# Patient Record
Sex: Male | Born: 2000 | Race: Black or African American | Hispanic: No | Marital: Single | Smoking: Never smoker
Health system: Southern US, Community
[De-identification: ages and names within clinical notes are randomized; demographics above are authoritative.]

---
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
## 2019-01-26 ENCOUNTER — Observation Stay (HOSPITAL_COMMUNITY)
Admission: EM | Admit: 2019-01-26 | Discharge: 2019-01-27 | Disposition: A | Discharge status: Home / Self Care | Payer: BC Managed Care – PPO | Attending: Neurological Surgery | Admitting: Neurological Surgery

## 2019-01-26 ENCOUNTER — Emergency Department (HOSPITAL_COMMUNITY): Payer: BC Managed Care – PPO

## 2019-01-26 ENCOUNTER — Other Ambulatory Visit: Payer: Self-pay

## 2019-01-26 ENCOUNTER — Encounter (HOSPITAL_COMMUNITY): Payer: Self-pay | Admitting: Emergency Medicine

## 2019-01-26 DIAGNOSIS — W3400XA Accidental discharge from unspecified firearms or gun, initial encounter: Secondary | ICD-10-CM

## 2019-01-26 DIAGNOSIS — S0103XA Puncture wound without foreign body of scalp, initial encounter: Secondary | ICD-10-CM | POA: Diagnosis present

## 2019-01-26 DIAGNOSIS — I609 Nontraumatic subarachnoid hemorrhage, unspecified: Secondary | ICD-10-CM

## 2019-01-26 DIAGNOSIS — S066X9A Traumatic subarachnoid hemorrhage with loss of consciousness of unspecified duration, initial encounter: Secondary | ICD-10-CM | POA: Diagnosis not present

## 2019-01-26 DIAGNOSIS — S0101XA Laceration without foreign body of scalp, initial encounter: Secondary | ICD-10-CM | POA: Diagnosis present

## 2019-01-26 DIAGNOSIS — Z20828 Contact with and (suspected) exposure to other viral communicable diseases: Secondary | ICD-10-CM | POA: Diagnosis not present

## 2019-01-26 LAB — COMPREHENSIVE METABOLIC PANEL
ALT: 23 U/L (ref 0–44)
AST: 27 U/L (ref 15–41)
Albumin: 4.3 g/dL (ref 3.5–5.0)
Alkaline Phosphatase: 95 U/L (ref 38–126)
Anion gap: 8 (ref 5–15)
BUN: 10 mg/dL (ref 6–20)
CO2: 27 mmol/L (ref 22–32)
Calcium: 9.9 mg/dL (ref 8.9–10.3)
Chloride: 103 mmol/L (ref 98–111)
Creatinine, Ser: 1.16 mg/dL (ref 0.61–1.24)
GFR calc Af Amer: >60 mL/min (ref >60)
GFR calc non Af Amer: >60 mL/min (ref >60)
Glucose, Bld: 106 mg/dL — ABNORMAL HIGH (ref 70–99)
Potassium: 4 mmol/L (ref 3.5–5.1)
Sodium: 138 mmol/L (ref 135–145)
Total Bilirubin: 0.7 mg/dL (ref 0.3–1.2)
Total Protein: 6.8 g/dL (ref 6.5–8.1)

## 2019-01-26 LAB — CBC
HCT: 42.2 % (ref 39.0–52.0)
Hemoglobin: 14.3 g/dL (ref 13.0–17.0)
MCH: 30.6 pg (ref 26.0–34.0)
MCHC: 33.9 g/dL (ref 30.0–36.0)
MCV: 90.4 fL (ref 80.0–100.0)
Platelets: 279 10*3/uL (ref 150–400)
RBC: 4.67 MIL/uL (ref 4.22–5.81)
RDW: 12.2 % (ref 11.5–15.5)
WBC: 9.3 10*3/uL (ref 4.0–10.5)
nRBC: 0 % (ref 0.0–0.2)

## 2019-01-26 LAB — HIV ANTIBODY (ROUTINE TESTING W REFLEX): HIV Screen 4th Generation wRfx: NONREACTIVE

## 2019-01-26 LAB — SAMPLE TO BLOOD BANK

## 2019-01-26 LAB — SARS CORONAVIRUS 2 (TAT 6-24 HRS): SARS Coronavirus 2: NEGATIVE

## 2019-01-26 LAB — ETHANOL: Alcohol, Ethyl (B): <10 mg/dL (ref <10)

## 2019-01-26 MED ORDER — DEXAMETHASONE SODIUM PHOSPHATE 4 MG/ML IJ SOLN
4.0000 mg | Freq: Four times a day (QID) | INTRAMUSCULAR | Status: AC
  Administered 2019-01-26 – 2019-01-27 (×3): 4 mg via INTRAVENOUS
  Filled 2019-01-26 (×3): qty 1

## 2019-01-26 MED ORDER — ERYTHROMYCIN 5 MG/GM OP OINT
TOPICAL_OINTMENT | Freq: Three times a day (TID) | OPHTHALMIC | Status: DC
  Administered 2019-01-26: 07:00:00 via OPHTHALMIC
  Filled 2019-01-26 (×2): qty 3.5

## 2019-01-26 MED ORDER — ONDANSETRON HCL 4 MG/2ML IJ SOLN: 4.0000 mg | Freq: Four times a day (QID) | INTRAMUSCULAR | Status: DC | PRN

## 2019-01-26 MED ORDER — TRAMADOL HCL 50 MG PO TABS: 100.0000 mg | ORAL_TABLET | Freq: Four times a day (QID) | ORAL | Status: DC | PRN

## 2019-01-26 MED ORDER — HYDROCODONE-ACETAMINOPHEN 5-325 MG PO TABS: 1.0000 | ORAL_TABLET | ORAL | Status: DC | PRN

## 2019-01-26 MED ORDER — FENTANYL CITRATE (PF) 100 MCG/2ML IJ SOLN
100.0000 ug | Freq: Once | INTRAMUSCULAR | Status: AC
  Administered 2019-01-26: 06:00:00 100 ug via INTRAVENOUS
  Filled 2019-01-26: qty 2

## 2019-01-26 MED ORDER — LIDOCAINE-EPINEPHRINE-TETRACAINE (LET) SOLUTION
3.0000 mL | Freq: Once | NASAL | Status: AC
  Administered 2019-01-26: 3 mL via TOPICAL
  Filled 2019-01-26: qty 3

## 2019-01-26 MED ORDER — ACETAMINOPHEN 325 MG PO TABS: 650.0000 mg | ORAL_TABLET | Freq: Four times a day (QID) | ORAL | Status: DC | PRN

## 2019-01-26 MED ORDER — TETRACAINE HCL 0.5 % OP SOLN
2.0000 [drp] | Freq: Once | OPHTHALMIC | Status: AC
  Administered 2019-01-26: 05:00:00 2 [drp] via OPHTHALMIC
  Filled 2019-01-26: qty 4

## 2019-01-26 MED ORDER — TETANUS-DIPHTH-ACELL PERTUSSIS 5-2.5-18.5 LF-MCG/0.5 IM SUSP
0.5000 mL | Freq: Once | INTRAMUSCULAR | Status: DC
  Filled 2019-01-26: qty 0.5

## 2019-01-26 MED ORDER — ACETAMINOPHEN 650 MG RE SUPP: 650.0000 mg | Freq: Four times a day (QID) | RECTAL | Status: DC | PRN

## 2019-01-26 MED ORDER — FLUORESCEIN SODIUM 1 MG OP STRP
ORAL_STRIP | OPHTHALMIC | Status: AC
  Filled 2019-01-26: qty 1

## 2019-01-26 MED ORDER — TETANUS-DIPHTH-ACELL PERTUSSIS 5-2.5-18.5 LF-MCG/0.5 IM SUSP: 0.5000 mL | Freq: Once | INTRAMUSCULAR | Status: DC

## 2019-01-26 MED ORDER — ONDANSETRON HCL 4 MG PO TABS: 4.0000 mg | ORAL_TABLET | Freq: Four times a day (QID) | ORAL | Status: DC | PRN
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
## 2019-01-26 NOTE — ED Notes (Signed)
MS   Breakfast ordered  
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
## 2019-01-27 ENCOUNTER — Observation Stay (HOSPITAL_COMMUNITY): Payer: BC Managed Care – PPO

## 2019-01-27 LAB — POCT I-STAT, CHEM 8
BUN: 9 mg/dL (ref 6–20)
Calcium, Ion: 1.28 mmol/L (ref 1.15–1.40)
Chloride: 100 mmol/L (ref 98–111)
Creatinine, Ser: 1.2 mg/dL (ref 0.61–1.24)
Glucose, Bld: 100 mg/dL — ABNORMAL HIGH (ref 70–99)
HCT: 43 % (ref 39.0–52.0)
Hemoglobin: 14.6 g/dL (ref 13.0–17.0)
Potassium: 3.8 mmol/L (ref 3.5–5.1)
Sodium: 140 mmol/L (ref 135–145)
TCO2: 30 mmol/L (ref 22–32)
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
## 2019-02-11 ENCOUNTER — Ambulatory Visit
Admission: RE | Admit: 2019-02-11 | Discharge: 2019-02-11 | Disposition: A | Discharge status: Home / Self Care | Payer: BC Managed Care – PPO | Source: Ambulatory Visit | Attending: Student | Admitting: Student

## 2019-02-11 ENCOUNTER — Other Ambulatory Visit: Payer: Self-pay | Admitting: Student

## 2019-02-11 ENCOUNTER — Other Ambulatory Visit: Payer: Self-pay

## 2019-02-11 DIAGNOSIS — I609 Nontraumatic subarachnoid hemorrhage, unspecified: Secondary | ICD-10-CM
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
## 2022-10-05 ENCOUNTER — Encounter (HOSPITAL_BASED_OUTPATIENT_CLINIC_OR_DEPARTMENT_OTHER): Payer: Self-pay

## 2022-10-05 ENCOUNTER — Other Ambulatory Visit: Payer: Self-pay

## 2022-10-05 ENCOUNTER — Emergency Department (HOSPITAL_BASED_OUTPATIENT_CLINIC_OR_DEPARTMENT_OTHER)
Admission: EM | Admit: 2022-10-05 | Discharge: 2022-10-05 | Disposition: A | Discharge status: Home / Self Care | Payer: BC Managed Care – PPO | Attending: Emergency Medicine | Admitting: Emergency Medicine

## 2022-10-05 ENCOUNTER — Emergency Department (HOSPITAL_BASED_OUTPATIENT_CLINIC_OR_DEPARTMENT_OTHER): Payer: BC Managed Care – PPO | Admitting: Radiology

## 2022-10-05 DIAGNOSIS — R001 Bradycardia, unspecified: Secondary | ICD-10-CM | POA: Insufficient documentation

## 2022-10-05 DIAGNOSIS — R072 Precordial pain: Secondary | ICD-10-CM | POA: Insufficient documentation

## 2022-10-05 LAB — CBC
HCT: 42.5 % (ref 39.0–52.0)
Hemoglobin: 14.3 g/dL (ref 13.0–17.0)
MCH: 30.3 pg (ref 26.0–34.0)
MCHC: 33.6 g/dL (ref 30.0–36.0)
MCV: 90 fL (ref 80.0–100.0)
Platelets: 267 10*3/uL (ref 150–400)
RBC: 4.72 MIL/uL (ref 4.22–5.81)
RDW: 12.3 % (ref 11.5–15.5)
WBC: 6.9 10*3/uL (ref 4.0–10.5)
nRBC: 0 % (ref 0.0–0.2)

## 2022-10-05 LAB — BASIC METABOLIC PANEL
Anion gap: 8 (ref 5–15)
BUN: 14 mg/dL (ref 6–20)
CO2: 31 mmol/L (ref 22–32)
Calcium: 10.1 mg/dL (ref 8.9–10.3)
Chloride: 102 mmol/L (ref 98–111)
Creatinine, Ser: 1.22 mg/dL (ref 0.61–1.24)
GFR, Estimated: >60 mL/min (ref >60)
Glucose, Bld: 60 mg/dL — ABNORMAL LOW (ref 70–99)
Potassium: 4 mmol/L (ref 3.5–5.1)
Sodium: 141 mmol/L (ref 135–145)

## 2022-10-05 LAB — TROPONIN I (HIGH SENSITIVITY): Troponin I (High Sensitivity): 5 ng/L (ref <18)

## 2022-10-05 NOTE — Discharge Instructions (Signed)
You were seen in the emergency department today for chest pain.  As we discussed your lab work, EKG, chest x-ray all looked reassuring today.   I recommend monitoring your stress levels and to follow-up with your primary care doctor at your earliest convenience.  Continue to monitor how you are doing overall, and return to the emergency department for any new or worsening symptoms such as: Worsening pain or pain with exertion, difficulty breathing, sweating, or pain or swelling in your legs.

## 2022-10-05 NOTE — ED Provider Notes (Signed)
[place] EMERGENCY DEPARTMENT AT [facility] Provider Note   CSN: [id number] Arrival date & time: 10/05/22  1903     History  Chief Complaint  Patient presents with   Chest Pain    Chris Turner is a 22 y.o. male.  Patient with no pertinent past medical history presents today with complaints of chest pain.  He states that he first noticed same around 10 AM this morning.  States that it is left-sided in nature and sharp.  It comes and steady intervals without a discernible trigger and last a second or so and then resolves.  It is not worse with exertion or relieved with rest.  It is not worse with movement.  It is not pleuritic in nature reproducible to palpation.  He denies any shortness of breath.  He states that this has happened before but is never lasted this long.  He has never been seen for the symptoms previously.  His last episode was about 30 minutes ago.  He denies any diaphoresis, nausea, vomiting, abdominal pain.  No fevers or chills.  No recent illness, cough, or congestion.  He denies any medical problems or cardiac history.  No family history of heart problems at a young age or unexpected sudden death at a young age.  He does note that he works out regularly but has not done any unusual heavy lifting recently.  Denies any recent travel or surgeries.  No leg pain or leg swelling.  No history of blood clots or malignancy.  He originally went to urgent care for the symptoms and had an EKG that was abnormal and he was sent here for evaluation.  The history is provided by the patient. No language interpreter was used.  Chest Pain      Home Medications Prior to Admission medications   Not on File      Allergies    Patient has no known allergies.    Review of Systems   Review of Systems  Cardiovascular:  Positive for chest pain (no pain currently).  All other systems reviewed and are negative.   Physical Exam Updated Vital Signs BP (!) 144/68    Pulse 79   Temp 98.5 F (36.9 C)   Resp 19   Ht 6\' 4"  (1.93 m)   Wt 99.8 kg   SpO2 97%   BMI 26.78 kg/m  Physical Exam Vitals and nursing note reviewed.  Constitutional:      General: He is not in acute distress.    Appearance: Normal appearance. He is normal weight. He is not ill-appearing, toxic-appearing or diaphoretic.  HENT:     Head: Normocephalic and atraumatic.  Cardiovascular:     Rate and Rhythm: Normal rate and regular rhythm.     Pulses:          Radial pulses are 2+ on the right side and 2+ on the left side.       Dorsalis pedis pulses are 2+ on the right side and 2+ on the left side.       Posterior tibial pulses are 2+ on the right side and 2+ on the left side.     Heart sounds: Normal heart sounds.  Pulmonary:     Effort: Pulmonary effort is normal. No respiratory distress.     Breath sounds: Normal breath sounds.  Chest:     Chest wall: No tenderness.  Abdominal:     Palpations: Abdomen is soft.     Tenderness: There is no  abdominal tenderness.  Musculoskeletal:        General: Normal range of motion.     Cervical back: Normal range of motion.     Right lower leg: No tenderness. No edema.     Left lower leg: No tenderness. No edema.  Skin:    General: Skin is warm and dry.  Neurological:     General: No focal deficit present.     Mental Status: He is alert.  Psychiatric:        Mood and Affect: Mood normal.        Behavior: Behavior normal.     ED Results / Procedures / Treatments   Labs (all labs ordered are listed, but only abnormal results are displayed) Labs Reviewed  BASIC METABOLIC PANEL - Abnormal; Notable for the following components:      Result Value   Glucose, Bld 60 (*)    All other components within normal limits  CBC  TROPONIN I (HIGH SENSITIVITY)    EKG EKG Interpretation Date/Time:  Thursday October 05 2022 19:11:24 EDT Ventricular Rate:  58 PR Interval:  152 QRS Duration:  86 QT Interval:  384 QTC Calculation: 376 R  Axis:   102  Text Interpretation: Sinus bradycardia Rightward axis Nonspecific ST and T wave abnormality No previous tracing Confirmed by Cathren Laine ([id number]) on 10/05/2022 7:21:19 PM  Radiology DG Chest 2 View  Result Date: 10/05/2022 CLINICAL DATA:  Chest pain EXAM: CHEST - 2 VIEW COMPARISON:  None Available. FINDINGS: The heart size and mediastinal contours are within normal limits. No focal airspace consolidation, pleural effusion, or pneumothorax. Scoliotic thoracolumbar curvature. IMPRESSION: No active cardiopulmonary disease. Electronically Signed   By: Duanne Guess D.O.   On: 10/05/2022 19:30    Procedures Procedures    Medications Ordered in ED Medications - No data to display  ED Course/ Medical Decision Making/ A&P             HEART Score: 0                Medical Decision Making Amount and/or Complexity of Data Reviewed Labs: ordered. Radiology: ordered.   This patient is a 22 y.o. male who presents to the ED for concern of chest pain, this involves an extensive number of treatment options, and is a complaint that carries with it a high risk of complications and morbidity. The emergent differential diagnosis prior to evaluation includes, but is not limited to,  ACS, pericarditis, myocarditis, aortic dissection, PE, pneumothorax, esophageal spasm or rupture, chronic angina, pneumonia, bronchitis, GERD, reflux/PUD, biliary disease, pancreatitis, costochondritis, anxiety   This is not an exhaustive differential.   Past Medical History / Co-morbidities / Social History: N/A  Additional history: Chart reviewed. Pertinent results include: Seen in urgent care earlier today for symptoms, referred here for abnormal EKG.  Physical Exam: Physical exam performed. The pertinent findings include: Per above, no pertinent physical exam findings  Lab Tests: I ordered, and personally interpreted labs.  The pertinent results include:  Glucose 60.  Troponin WNL.  No other acute  physical exam findings.  Patient does state that he has not eaten today.   Imaging Studies: I ordered imaging studies including CXR. I independently visualized and interpreted imaging which showed NAD. I agree with the radiologist interpretation.   Cardiac Monitoring:  The patient was maintained on a cardiac monitor.  My attending physician Dr. Denton Lank viewed and interpreted the cardiac monitored which showed an underlying rhythm of: no STEMI. I agree with  this interpretation.   Disposition: After consideration of the diagnostic results and the patients response to treatment, I feel that emergency department workup does not suggest an emergent condition requiring admission or immediate intervention beyond what has been performed at this time. The plan is: Discharge with close outpatient follow-up and return precautions.  Patient is PERC negative.  He is currently pain-free.  His heart score is 0.  Low suspicion for cardiac cause of symptoms.  Workup is benign.  Evaluation and diagnostic testing in the emergency department does not suggest an emergent condition requiring admission or immediate intervention beyond what has been performed at this time.  Plan for discharge with close PCP follow-up.  Patient is understanding and amenable with plan, educated on red flag symptoms that would prompt immediate return.  Patient discharged in stable condition.  Final Clinical Impression(s) / ED Diagnoses Final diagnoses:  Precordial chest pain    Rx / DC Orders ED Discharge Orders     None     An After Visit Summary was printed and given to the patient.     Vear Clock 10/05/22 2245    Cathren Laine, MD 10/05/22 2340

## 2022-10-05 NOTE — ED Triage Notes (Signed)
Pt c/o intermittent left sided chest pain that started this morning around 10:00.Pt describes pain as sharp.

## 2022-10-05 NOTE — ED Notes (Signed)
All appropriate discharge materials reviewed at length with patient. Time for questions provided. Pt has no other questions at this time and verbalizes understanding of all provided materials.  

## 2022-10-05 NOTE — ED Notes (Signed)
MSE E signature not working
